# Patient Record
Sex: Female | Born: 1965 | Race: White | Hispanic: No | Smoking: Never smoker
Health system: Southern US, Community
[De-identification: ages and names within clinical notes are randomized; demographics above are authoritative.]

## PROBLEM LIST (undated history)

## (undated) DIAGNOSIS — G43109 Migraine with aura, not intractable, without status migrainosus: Secondary | ICD-10-CM

## (undated) HISTORY — DX: Migraine with aura, not intractable, without status migrainosus: G43.109

---

## 1998-07-29 ENCOUNTER — Ambulatory Visit (HOSPITAL_COMMUNITY): Admission: RE | Admit: 1998-07-29 | Discharge: 1998-07-29 | Payer: Self-pay | Admitting: Neurosurgery

## 1998-07-29 ENCOUNTER — Encounter: Payer: Self-pay | Admitting: Neurosurgery

## 2000-10-21 ENCOUNTER — Other Ambulatory Visit: Admission: RE | Admit: 2000-10-21 | Discharge: 2000-10-21 | Payer: Self-pay | Admitting: *Deleted

## 2004-03-04 HISTORY — PX: WISDOM TOOTH EXTRACTION: SHX21

## 2006-02-14 ENCOUNTER — Ambulatory Visit: Payer: Self-pay | Admitting: *Deleted

## 2006-10-14 ENCOUNTER — Encounter: Payer: Self-pay | Admitting: Specialist

## 2006-11-03 ENCOUNTER — Encounter: Payer: Self-pay | Admitting: Specialist

## 2007-06-27 ENCOUNTER — Other Ambulatory Visit: Admission: RE | Admit: 2007-06-27 | Discharge: 2007-06-27 | Payer: Self-pay | Admitting: Obstetrics and Gynecology

## 2007-08-06 ENCOUNTER — Ambulatory Visit: Payer: Self-pay

## 2008-08-27 ENCOUNTER — Other Ambulatory Visit: Admission: RE | Admit: 2008-08-27 | Discharge: 2008-08-27 | Payer: Self-pay | Admitting: Obstetrics and Gynecology

## 2008-11-04 ENCOUNTER — Ambulatory Visit: Payer: Self-pay

## 2009-12-29 ENCOUNTER — Ambulatory Visit: Payer: Self-pay

## 2012-01-03 IMAGING — MG MAM DGTL SCREENING MAMMO W/CAD
1 series · 6 of 6 positions shown · non-contrast
Comparison: none

REASON FOR EXAM: scr
COMMENTS:

[Series 2481: R CC · right · 6 of 6 slices shown]
[im 1/6]
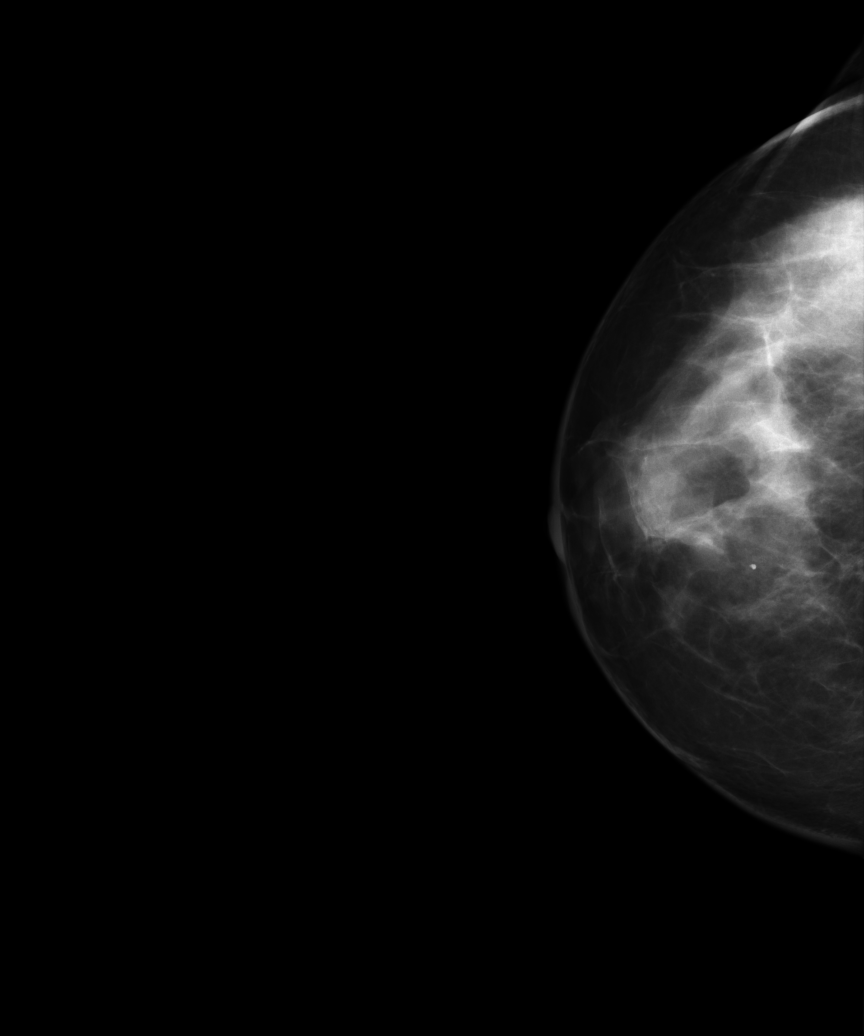
[im 2/6]
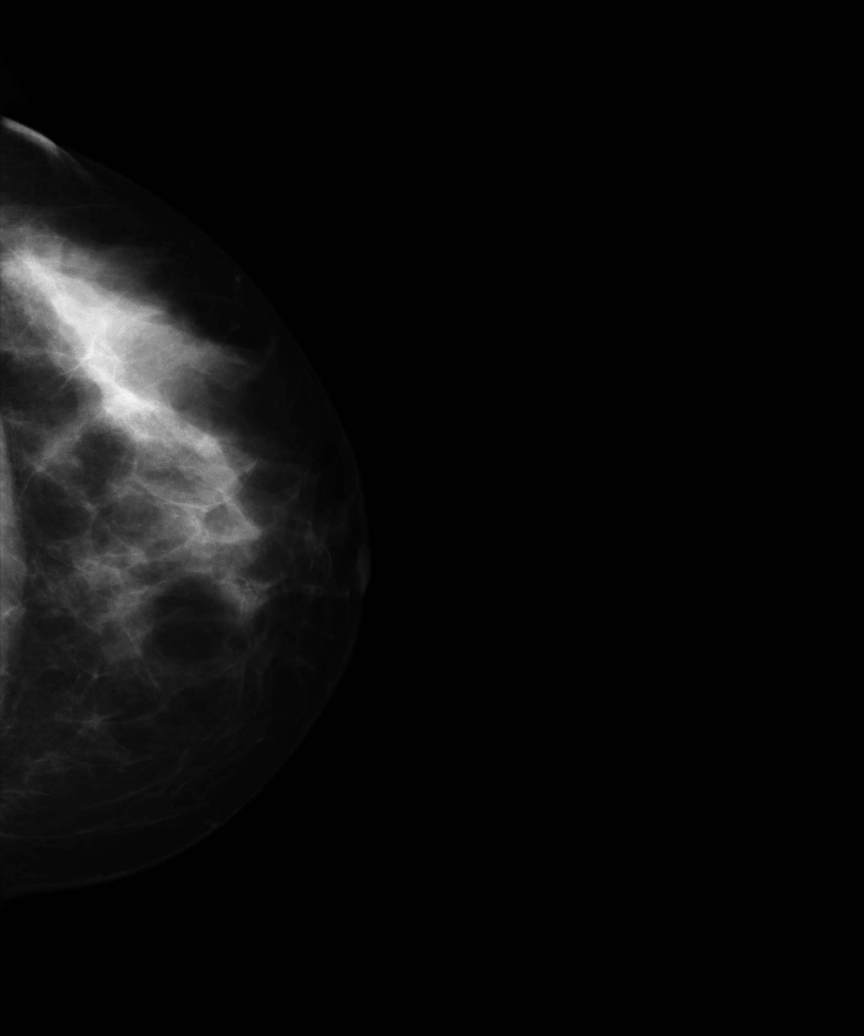
[im 3/6]
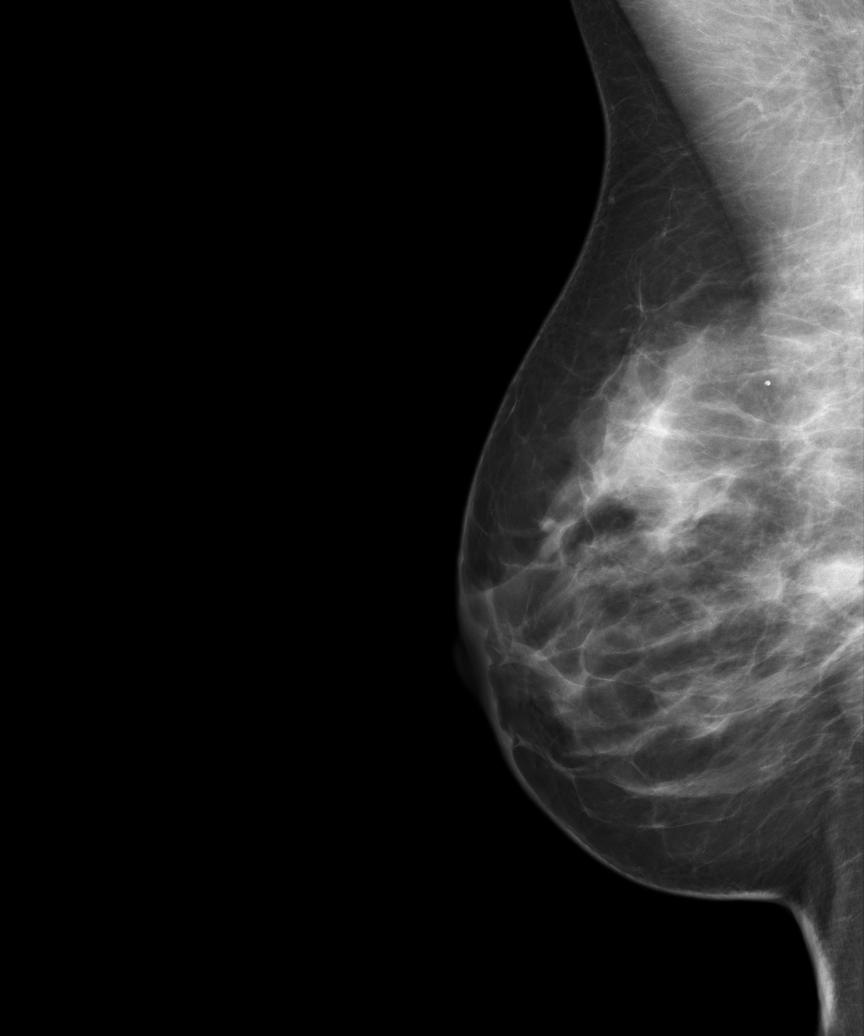
[im 4/6]
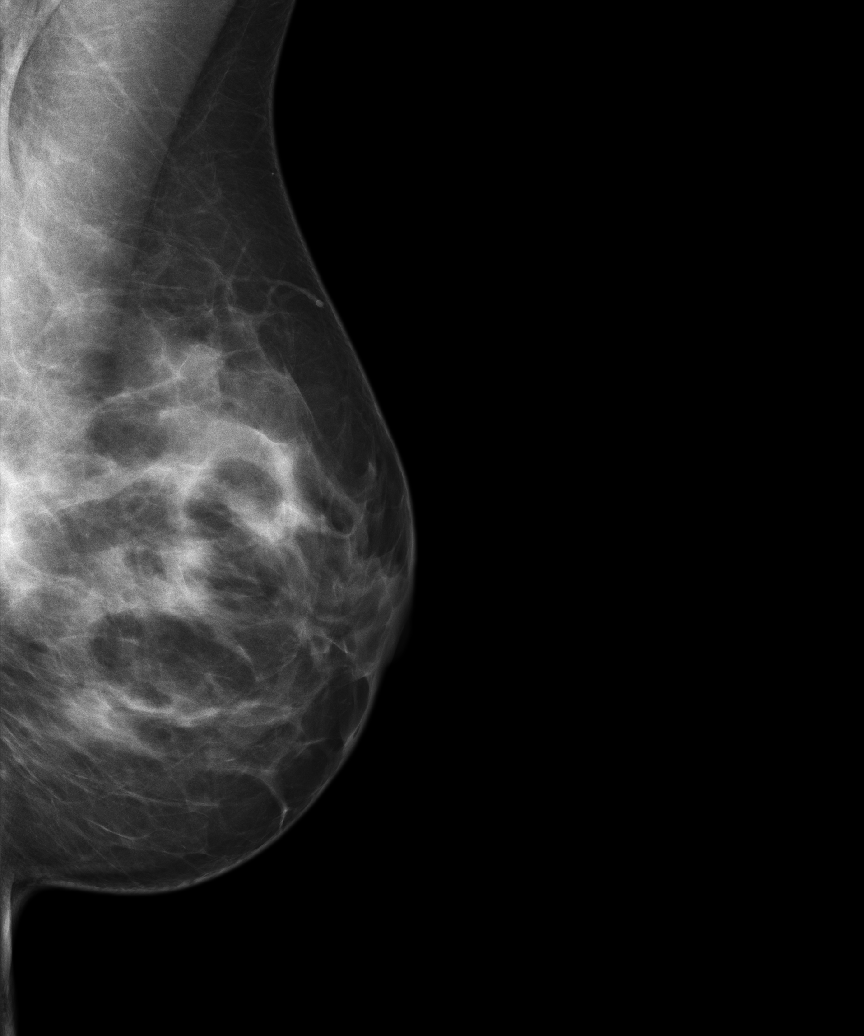
[im 5/6]
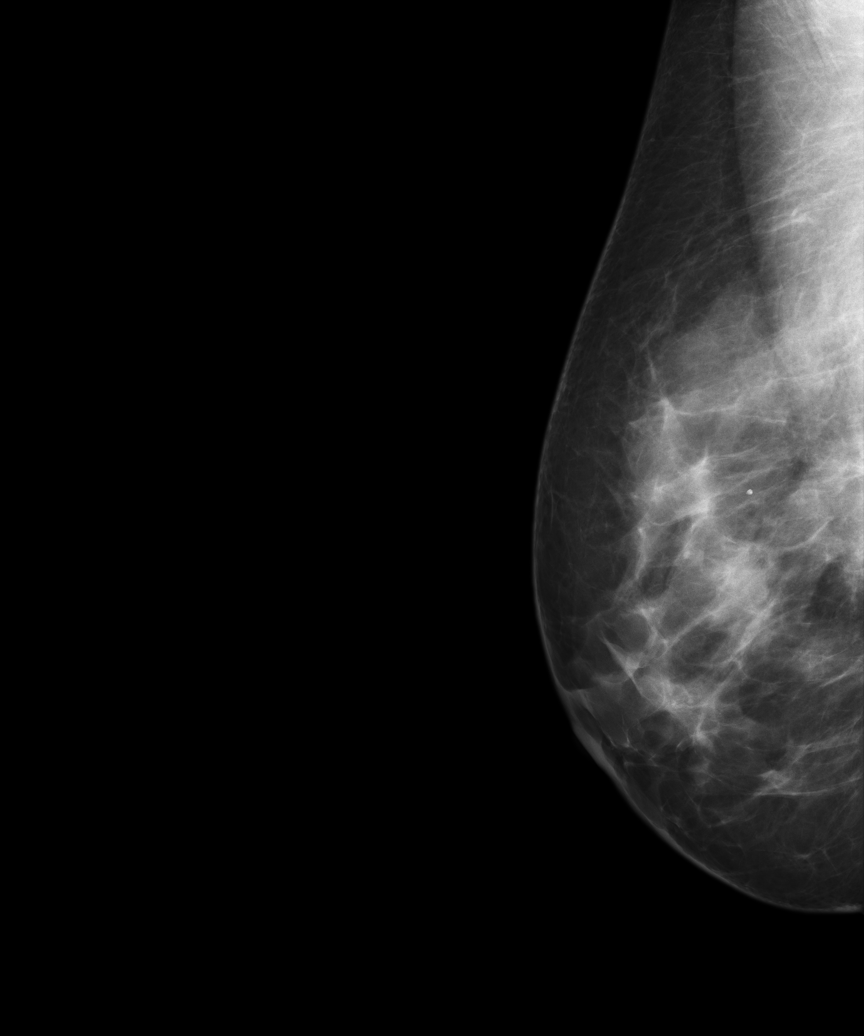
[im 6/6]
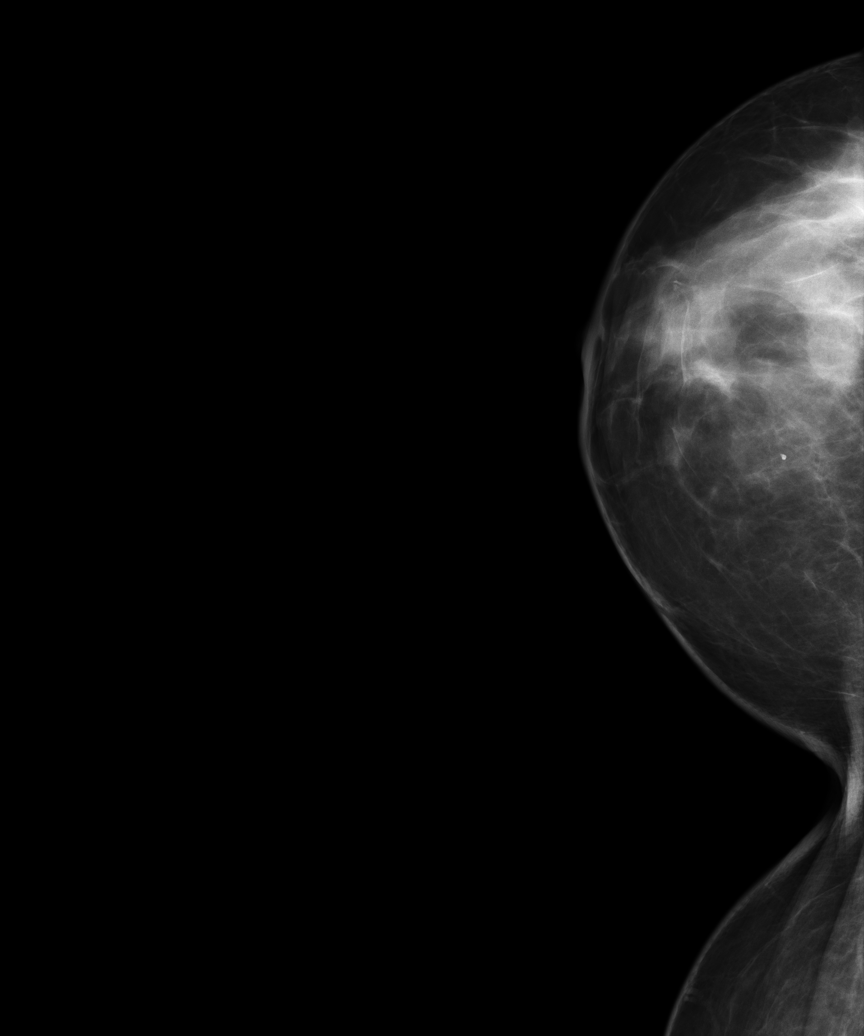

[6 of 6 positions shown; findings below may reference images not displayed]

PROCEDURE:     MAM - MAM DGTL SCREENING MAMMO W/CAD  - December 29, 2009  [DATE]

RESULT:     Comparison is made to previous digital examinations performed on
11/04/2008, 08/06/2007 and to right breast images dated 08/15/2004.

The breasts exhibit a moderate to dense, somewhat heterogeneous parenchymal
pattern. There is stable, benign calcification in the upper central right
breast posteriorly. Additional comparison is made to right breast digital
images of 08/06/2007 and 02/14/2006. On today's MLO exam of the right
breast, there is some parenchymal density projecting over the pectoralis
muscle near the level of the nipple along the extreme posterior margin of
the examined portion of the breast. This appears to be slightly more
prominent than demonstrated on the 3282, 0005 and 8556 images but is similar
in appearance compared to 08/06/2007. This is not evident and is likely too
posterior for visualization on the CC images. Exaggerated CCM and CCL
projections of the right breast show less nodularity of the parenchymal
pattern on the CCL but the posterior parenchymal density seen on the MLO is
likely not visualized in this field. The patient is currently asymptomatic.
There is no known family history of breast cancer.
IMPRESSION: Stable, benign appearing bilateral mammogram.

BI-RADS: Category 2 - Benign Findings

RECOMMENDATION:  Please continue to encourage annual mammographic follow-up.

Thank you for this opportunity to contribute to the care of your patient.

A NEGATIVE MAMMOGRAM REPORT DOES NOT PRECLUDE BIOPSY OR OTHER EVALUATION OF
A CLINICALLY PALPABLE OR OTHERWISE SUSPICIOUS MASS OR LESION. BREAST CANCER
MAY NOT BE DETECTED BY MAMMOGRAPHY IN UP TO 10% OF CASES.

## 2012-07-04 ENCOUNTER — Emergency Department (HOSPITAL_BASED_OUTPATIENT_CLINIC_OR_DEPARTMENT_OTHER)
Admission: EM | Admit: 2012-07-04 | Discharge: 2012-07-04 | Disposition: A | Discharge status: Home / Self Care | Payer: Commercial Managed Care - PPO | Attending: Emergency Medicine | Admitting: Emergency Medicine

## 2012-07-04 ENCOUNTER — Encounter (HOSPITAL_BASED_OUTPATIENT_CLINIC_OR_DEPARTMENT_OTHER): Payer: Self-pay | Admitting: *Deleted

## 2012-07-04 ENCOUNTER — Emergency Department (HOSPITAL_BASED_OUTPATIENT_CLINIC_OR_DEPARTMENT_OTHER): Payer: Commercial Managed Care - PPO

## 2012-07-04 DIAGNOSIS — R059 Cough, unspecified: Secondary | ICD-10-CM | POA: Insufficient documentation

## 2012-07-04 DIAGNOSIS — R079 Chest pain, unspecified: Secondary | ICD-10-CM | POA: Insufficient documentation

## 2012-07-04 DIAGNOSIS — M549 Dorsalgia, unspecified: Secondary | ICD-10-CM

## 2012-07-04 DIAGNOSIS — R05 Cough: Secondary | ICD-10-CM

## 2012-07-04 MED ORDER — HYDROCODONE-ACETAMINOPHEN 5-325 MG PO TABS: 2.0000 | ORAL_TABLET | ORAL | Status: DC | PRN

## 2012-07-04 MED ORDER — CYCLOBENZAPRINE HCL 5 MG PO TABS: 5.0000 mg | ORAL_TABLET | Freq: Two times a day (BID) | ORAL | Status: DC | PRN

## 2012-07-04 MED ORDER — OXYCODONE-ACETAMINOPHEN 5-325 MG PO TABS: 1.0000 | ORAL_TABLET | ORAL | Status: DC | PRN

## 2012-07-04 MED ORDER — KETOROLAC TROMETHAMINE 30 MG/ML IJ SOLN
60.0000 mg | Freq: Once | INTRAMUSCULAR | Status: AC
  Administered 2012-07-04: 60 mg via INTRAMUSCULAR
  Filled 2012-07-04: qty 1

## 2012-07-04 NOTE — ED Notes (Signed)
Cough scratchy throat x 2 days. Sharp pain under her right shoulder blade that has gotten worse.

## 2012-07-04 NOTE — ED Notes (Signed)
Family at bedside. 

## 2012-07-04 NOTE — ED Provider Notes (Signed)
History     CSN: [id number]  Arrival date & time 07/04/12  1811   First MD Initiated Contact with Patient 07/04/12 1831      Chief Complaint  Patient presents with  . Cough    (Consider location/radiation/quality/duration/timing/severity/associated sxs/prior treatment) HPI Comments: Pt states that she is having pain under the right shoulder blade  Patient is a 47 y.o. female presenting with cough. The history is provided by the patient. No language interpreter was used.  Cough This is a new problem. The current episode started 2 days ago. The problem occurs constantly. The problem has not changed since onset.The cough is non-productive. There has been no fever. Treatments tried: nsaid. The treatment provided no relief.    History reviewed. No pertinent past medical history.  Past Surgical History  Procedure Date  . Cesarean section     No family history on file.  History  Substance Use Topics  . Smoking status: Never Smoker   . Smokeless tobacco: Not on file  . Alcohol Use: No    OB History    Grav Para Term Preterm Abortions TAB SAB Ect Mult Living                  Review of Systems  Constitutional: Negative.   Respiratory: Positive for cough.   Cardiovascular: Negative.     Allergies  Review of patient's allergies indicates no known allergies.  Home Medications  No current outpatient prescriptions on file.  BP 126/59  Pulse 70  Temp 98.3 F (36.8 C) (Oral)  Resp 20  SpO2 100%  Physical Exam  Nursing note and vitals reviewed. Constitutional: She is oriented to person, place, and time. She appears well-developed and well-nourished.  HENT:  Head: Normocephalic and atraumatic.  Eyes: Conjunctivae normal are normal.  Neck: Neck supple.  Cardiovascular: Normal rate and regular rhythm.   Pulmonary/Chest: Effort normal and breath sounds normal.       Pt tender in the right posterior rib  Musculoskeletal: Normal range of motion.  Neurological: She is  alert and oriented to person, place, and time.  Skin: Skin is dry.  Psychiatric: She has a normal mood and affect.    ED Course  Procedures (including critical care time)  Labs Reviewed - No data to display Dg Chest 2 View  07/04/2012  *RADIOLOGY REPORT*  Clinical Data: Cough, congestion, pain  CHEST - 2 VIEW  Comparison: None.  Findings: Lungs are clear. No pleural effusion or pneumothorax.  Cardiomediastinal silhouette is within normal limits.  Visualized osseous structures are within normal limits.  IMPRESSION: Normal chest radiographs.   Original Report Authenticated By: Charline Bills, M.D.      1. Back pain   2. Cough       MDM  Pt is feeling somewhat better after toradol:will treat with vicodin and flexeril at home        Teressa Lower, NP 07/04/12 2031

## 2012-07-04 NOTE — ED Notes (Signed)
MD at bedside. 

## 2012-07-05 NOTE — ED Provider Notes (Signed)
Medical screening examination/treatment/procedure(s) were performed by non-physician practitioner and as supervising physician I was immediately available for consultation/collaboration.  Coryn Mosso, MD 07/05/12 0043 

## 2013-04-08 ENCOUNTER — Encounter: Payer: Self-pay | Admitting: Obstetrics and Gynecology

## 2013-04-10 ENCOUNTER — Ambulatory Visit: Payer: Self-pay | Admitting: Obstetrics and Gynecology

## 2013-04-10 ENCOUNTER — Ambulatory Visit (INDEPENDENT_AMBULATORY_CARE_PROVIDER_SITE_OTHER): Payer: Commercial Managed Care - PPO | Admitting: Gynecology

## 2013-04-10 ENCOUNTER — Encounter: Payer: Self-pay | Admitting: Gynecology

## 2013-04-10 VITALS — BP 108/68 | HR 62 | Resp 16 | Ht 62.75 in | Wt 127.0 lb

## 2013-04-10 DIAGNOSIS — L749 Eccrine sweat disorder, unspecified: Secondary | ICD-10-CM

## 2013-04-10 DIAGNOSIS — G43109 Migraine with aura, not intractable, without status migrainosus: Secondary | ICD-10-CM

## 2013-04-10 DIAGNOSIS — Z0000 Encounter for general adult medical examination without abnormal findings: Secondary | ICD-10-CM

## 2013-04-10 DIAGNOSIS — Z124 Encounter for screening for malignant neoplasm of cervix: Secondary | ICD-10-CM

## 2013-04-10 DIAGNOSIS — Z309 Encounter for contraceptive management, unspecified: Secondary | ICD-10-CM

## 2013-04-10 DIAGNOSIS — Z01419 Encounter for gynecological examination (general) (routine) without abnormal findings: Secondary | ICD-10-CM

## 2013-04-10 LAB — HEMOGLOBIN, FINGERSTICK: Hemoglobin, fingerstick: 13.7 g/dL (ref 12.0–16.0)

## 2013-04-10 MED ORDER — NORETHINDRONE 0.35 MG PO TABS: 1.0000 | ORAL_TABLET | Freq: Every day | ORAL | Status: DC
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
## 2013-04-10 NOTE — Patient Instructions (Signed)

## 2013-04-13 ENCOUNTER — Other Ambulatory Visit: Payer: Self-pay | Admitting: Obstetrics and Gynecology

## 2013-04-13 NOTE — Telephone Encounter (Signed)
Spoke with pt. And advised RX was Escribed on 04-10-13 at 3:41 pm to pharmacy.  She will call pharmacy to verify.

## 2013-04-13 NOTE — Telephone Encounter (Signed)
Pt calling to check on refill request for Micronor to [facility] at [phone].

## 2013-04-13 NOTE — Telephone Encounter (Signed)
Called pt. To verify her pharmacy had received her RX for OCP's --Micronor.  LMOVM.

## 2013-04-15 LAB — IPS PAP TEST WITH HPV

## 2013-04-16 ENCOUNTER — Other Ambulatory Visit: Payer: Self-pay | Admitting: Gynecology

## 2013-04-16 DIAGNOSIS — IMO0002 Reserved for concepts with insufficient information to code with codable children: Secondary | ICD-10-CM

## 2013-04-20 ENCOUNTER — Telehealth: Payer: Self-pay | Admitting: Orthopedic Surgery

## 2013-04-20 NOTE — Telephone Encounter (Signed)
Patient returned call and spoke with patient earlier. Was given instructions on procedure. She would like to reschedule because of thanksgiving travel.  Appointment rescheduled.

## 2013-04-20 NOTE — Telephone Encounter (Signed)
LMTCB  aa 

## 2013-04-20 NOTE — Telephone Encounter (Signed)
Message copied by Alfredo Batty on Mon Apr 20, 2013  2:28 PM ------      Message from: Douglass Rivers      Created: Thu Apr 16, 2013  8:11 AM       Will need colpo, order dropped, ASCUS+HRHPV ------

## 2013-04-20 NOTE — Telephone Encounter (Signed)
LMTCB to discuss benefits for Colpo

## 2013-04-21 NOTE — Telephone Encounter (Signed)
04-20-13 2:35 pm-Spoke with pt about Pap results and scheduled colpo with TL 04-27-13. See result note.

## 2013-04-27 ENCOUNTER — Ambulatory Visit: Payer: Commercial Managed Care - PPO | Admitting: Gynecology

## 2013-05-11 ENCOUNTER — Ambulatory Visit (INDEPENDENT_AMBULATORY_CARE_PROVIDER_SITE_OTHER): Payer: Commercial Managed Care - PPO | Admitting: Gynecology

## 2013-05-11 ENCOUNTER — Encounter: Payer: Self-pay | Admitting: Gynecology

## 2013-05-11 VITALS — BP 118/74 | HR 64 | Resp 12 | Ht 62.75 in | Wt 127.0 lb

## 2013-05-11 DIAGNOSIS — IMO0002 Reserved for concepts with insufficient information to code with codable children: Secondary | ICD-10-CM

## 2013-05-11 DIAGNOSIS — R6889 Other general symptoms and signs: Secondary | ICD-10-CM

## 2013-05-11 NOTE — Patient Instructions (Signed)
Colposcopy Post Procedure Instructions  * You may take Ibuprofen, Aleve, or Tylenol for cramping as if needed.  * If Monsel's solution is used, you may have a slight black discharge for a day or two.   * Light bleeding is normal. If bleeding is heavier than your period, please call our office.  * Refrain from putting anything in your vagina until the bleeding or discharge stops (usually 2 or 3 days).  * You will be notified within one week of your biopsy results, or we will discuss your results at your follow-up appointment if needed.  

## 2013-05-11 NOTE — Progress Notes (Signed)
Subjective:     Patient ID: Megan Powers, female   DOB: Sep 01, 1965, 47 y.o.   MRN: [id number]  HPI Comments: 47 yo G2P2, here for colpo for ASCUS +HRHPHPV.  2 Patient's last menstrual period was 02/02/2013.  Pt is on micronor  Procedure explained and patient's questions were invited and answered.  Consent form signed.    Role of HPV in genesis of SIL discussed with patient, and questions answered.      Review of Systems     Objective:   Physical Exam  Nursing note and vitals reviewed. Constitutional: She is oriented to person, place, and time. She appears well-developed and well-nourished.  Genitourinary:    Neurological: She is alert and oriented to person, place, and time.       Assessment:     ASCUS, +HRHPV     Plan:     Colposcopy      Blood pressure 118/74, pulse 64, resp. rate 12, height 5' 2.75" (1.594 m), weight 127 lb (57.607 kg), last menstrual period 02/02/2013.   Speculum inserted atraumatically and cervix visualized.  3% acetic acid applied.  Cervix examined using 3.75 and 7.5   X magnification and green filter.    Gross appearance:normal  Squamocolumnar junction seen in entirety: yes   no visible lesions, no mosaicism, no punctation and no abnormal vasculature  cervix swabbed with Lugol's solution, endocervical speculum placed, SCJ visualized 360 degrees without lesions, endocervical curettage performed, specimen labelled and sent to pathology and hemostasis achieved with Monsel's solution  Extent of lesion entirely seen: yes  Patient tolerated procedure well.   Plan:  Will base further treatment on Pathology findings.  Post biopsy instructions and AVS given to patient.

## 2013-05-27 ENCOUNTER — Encounter: Payer: Self-pay | Admitting: Neurology

## 2013-05-27 ENCOUNTER — Ambulatory Visit (INDEPENDENT_AMBULATORY_CARE_PROVIDER_SITE_OTHER): Payer: Commercial Managed Care - PPO | Admitting: Neurology

## 2013-05-27 VITALS — BP 105/69 | HR 69 | Resp 17 | Ht 65.0 in | Wt 128.0 lb

## 2013-05-27 DIAGNOSIS — G43909 Migraine, unspecified, not intractable, without status migrainosus: Secondary | ICD-10-CM

## 2013-05-27 DIAGNOSIS — L749 Eccrine sweat disorder, unspecified: Secondary | ICD-10-CM

## 2013-05-27 DIAGNOSIS — G909 Disorder of the autonomic nervous system, unspecified: Secondary | ICD-10-CM

## 2013-05-27 DIAGNOSIS — G901 Familial dysautonomia [Riley-Day]: Secondary | ICD-10-CM

## 2013-05-27 NOTE — Progress Notes (Addendum)
[facility]  Provider:  Melvyn Powers, M D  Referring Provider: Bennye Alm, MD Primary Care Physician:  No PCP Per Patient  Chief Complaint  Patient presents with  . eccrine sweat disorder    NP, internal referral, Rm 11    HPI:  Megan Powers is a 47 y.o. female  Is seen here as a referral  from Megan Powers for eccrine gland disorder, Dysautonomia.   Megan Powers is seen here today for an evaluation of dysautonomia of the left lower extremity.    The patient reports she notices intermittently her left leg to be sweating profusely, while her right leg or the rest of the body ( torso, arms, face )does not. At other times the left leg feels cold and clammy- and no other body part seems to do the same at the same time.  This patient is physically very active and young appearing , she is a regular exerciser and has especially noted the asymmetry in the lower extremities skin color,  puffiness and diaphoresis when exercising. She mentioned this to her gynecologist about 2 years ago, but she is not quite sure when she first noticed this dysfunction. 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  During that time she certainly didn't have the opportunity for reflection and little time for herself.   Her daughter got married about 4 years ago and the family took wedding pictures in [place]- It was there when her daughter noticed her mother's left leg to be sweating and the shorts to be drenched only on the left . She describes that her left foot's sock will be wet,  The whole leg up to the groin line and the  left buttock is sweating. There is no involvement of the torso. The crouch of her underwear may be wet, too.  In short:  Unilateral lower extremity sweating , not involving torso or upper extremity. Ongoing for about 4 years, likely longer . She has right  migraines with right facial numbness.  She has known anisocoria-  Adie's pupil , she was diagnosed  by neuro-ophthalmologist Megan Powers at [facility],    Review of Systems: Out of a complete 14 system review, the patient complains of only the following symptoms, and all other reviewed systems are negative.  unilateral leg sweating , remote history of migraines, adie's pupil.   History   Social History  . Marital Status: Divorced    Spouse Name: N/A    Number of Children: 2  . Years of Education: college   Occupational History  . [employer]    Social History Main Topics  . Smoking status: Never Smoker   . Smokeless tobacco: Not on file  . Alcohol Use: 2.0 oz/week    4 drink(s) per week     Comment: Occassionally  . Drug Use: No  . Sexual Activity: Yes    Partners: Male   Other Topics Concern  . Not on file   Social History Narrative  . No narrative on file    Family History  Problem Relation Age of Onset  . Diabetes Maternal Grandmother   . Diabetes Paternal Grandmother   . CVA Mother   . Cerebral aneurysm Mother   . Heart attack Father 27  . Dementia Father     Past Medical History  Diagnosis Date  . Migraine with aura  Past Surgical History  Procedure Laterality Date  . Cesarean section  1988  . Wisdom tooth extraction  03/2004    Current Outpatient Prescriptions  Medication Sig Dispense Refill  . Acetaminophen (TYLENOL PO) Take by mouth as needed.      . norethindrone (MICRONOR,CAMILA,ERRIN) 0.35 MG tablet Take 1 tablet (0.35 mg total) by mouth daily.  3 Package  3   No current facility-administered medications for this visit.    Allergies as of 05/27/2013  . (No Known Allergies)    Vitals: BP 105/69  Pulse 69  Resp 17  Ht 5\' 5"  (1.651 m)  Wt 128 lb (58.06 kg)  BMI 21.30 kg/m2  LMP 02/02/2013 Last Weight:  Wt Readings from Last 1 Encounters:  05/27/13 128 lb (58.06 kg)   Last Height:   Ht Readings from Last 1 Encounters:   05/27/13 5\' 5"  (1.651 m)    Physical exam:  General: The patient is awake, alert and appears not in acute distress. The patient is well groomed. Head: Normocephalic, atraumatic. Neck is supple. Mallampati 2  neck circumference: 14 inches.   Cardiovascular:  Regular rate and rhythm, without  murmurs or carotid bruit, and without distended neck veins. Respiratory: Lungs are clear to auscultation. Skin:  Without evidence of edema, or rash- today there is no sweating.  Trunk: BMI is normal, this  patient  has normal posture.  Neurologic exam : The patient is awake and alert, oriented to place and time.  Memory subjective  described as intact. There is a normal attention span & concentration ability. Speech is fluent without  dysarthria, dysphonia or aphasia.  Mood and affect are appropriate.  Cranial nerves: Pupils with anisocoria , left wider by 2 mm wider - and  Left less briskly reactive to light. Disrounded , but only her right pupil- right eye is photophobic. Tear flow from the left .  Funduscopic exam without  evidence of pallor or edema. Extraocular movements  in vertical and horizontal planes intact and without nystagmus. Visual fields by finger perimetry are intact. Hearing to finger rub intact. Facial sensation intact to fine touch.  Facial motor strength is symmetric and tongue and uvula move midline.  Motor exam: Normal tone and  muscle bulk and symmetric strength in all extremities.  Sensory:  Fine touch, pinprick and vibration were tested in all extremities.  Proprioception is tested in the upper extremities only. This was normal.  Coordination: Rapid alternating movements, Finger-to-nose maneuver  without evidence of ataxia, dysmetria , but noticeable  action tremor. Noted with both arms outstretched.  Both hands , essential tremor present for  over a decade.  Some times she has sudden jerking movements, affecting her hand writing.   Gait and station: Patient walks without  assistive device and is able and assisted stool climb up to the exam table. Strength within normal limits.  Stance is stable and normal. Tandem gait is unfragmented.  Romberg testing is negative.  Deep tendon reflexes: in the  upper and lower extremities are symmetric and intact.  Babinski maneuver response is downgoing:     Assessment:  After physical and neurologic examination, review of laboratory studies, imaging, neurophysiology testing and pre-existing records, assessment is:   Left leg sweating. This autonomic dysfunction cn only be located at the pelvic level, cervical lesion  would have let to the torso and arm being involved.   No general menopausal hot flushes noticed. No vaginal dryness.   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     Plan:  Treatment plan and additional workup :  Reviewed and discussed , order pelvic CT for evaluation of pelvic ganglia, mass effect ?  There is no history of trauma and no history of malignancy. Patient would like her imaging studies done in [facility] .

## 2013-06-10 NOTE — Addendum Note (Signed)
Addended by: Melvyn NovasHMEIER, Ayanah Snader on: 06/10/2013 12:20 PM   Modules accepted: Orders

## 2013-06-19 ENCOUNTER — Telehealth: Payer: Self-pay | Admitting: Neurology

## 2013-06-19 ENCOUNTER — Other Ambulatory Visit: Payer: Self-pay

## 2013-06-19 ENCOUNTER — Other Ambulatory Visit: Payer: Self-pay | Admitting: *Deleted

## 2013-06-19 DIAGNOSIS — L749 Eccrine sweat disorder, unspecified: Secondary | ICD-10-CM

## 2013-06-19 DIAGNOSIS — G901 Familial dysautonomia [Riley-Day]: Secondary | ICD-10-CM

## 2013-06-19 NOTE — Telephone Encounter (Signed)
RETURNING CALL  °

## 2013-06-19 NOTE — Telephone Encounter (Signed)
I called and spoke to pt about the CT abd / pelvis now with and without contrast per Dr. Hosie PoissonSumner.  Order placed and sent to referrals for authorization.

## 2013-06-19 NOTE — Addendum Note (Signed)
Addended byHermenia Fiscal: Kashif Pooler on: 06/19/2013 03:31 PM   Modules accepted: Orders

## 2013-06-23 ENCOUNTER — Telehealth: Payer: Self-pay | Admitting: Neurology

## 2013-06-23 DIAGNOSIS — L749 Eccrine sweat disorder, unspecified: Secondary | ICD-10-CM

## 2013-06-23 DIAGNOSIS — G901 Familial dysautonomia [Riley-Day]: Secondary | ICD-10-CM

## 2013-06-23 NOTE — Telephone Encounter (Signed)
Dr. Vickey Hugerohmeier did speak with the Dr.

## 2013-06-23 NOTE — Telephone Encounter (Signed)
Dr. Devoria GlassingHassles (?) office calling to speak with Dr. Vickey Hugerohmeier about the CT scan they are going to do, attempted to page and call assistant and nurse but no response.

## 2013-06-25 ENCOUNTER — Other Ambulatory Visit: Payer: Self-pay

## 2013-06-25 DIAGNOSIS — G901 Familial dysautonomia [Riley-Day]: Secondary | ICD-10-CM

## 2013-06-25 DIAGNOSIS — L749 Eccrine sweat disorder, unspecified: Secondary | ICD-10-CM

## 2013-06-25 NOTE — Telephone Encounter (Signed)
Per Dr. Vickey Hugerohmeier change order to CT pelvis with oral and IV contrast.   At [facility].  [facility].  [id number], f [phone]

## 2013-12-15 ENCOUNTER — Ambulatory Visit: Payer: Commercial Managed Care - PPO | Admitting: Neurology

## 2014-02-15 ENCOUNTER — Telehealth: Payer: Self-pay

## 2014-02-15 DIAGNOSIS — Z30011 Encounter for initial prescription of contraceptive pills: Secondary | ICD-10-CM

## 2014-02-15 MED ORDER — NORETHINDRONE 0.35 MG PO TABS: 1.0000 | ORAL_TABLET | Freq: Every day | ORAL | Status: DC

## 2014-02-15 NOTE — Telephone Encounter (Signed)
Last AEX: 04/10/13 Last refill:04/10/13 #3 packs X 3 Current AEX: 04/16/14  Sent refill until AEX

## 2014-02-24 IMAGING — CR DG CHEST 2V
2 series · 2 of 2 positions shown · non-contrast
Comparison: None.

CLINICAL DATA: Cough, congestion, pain

CHEST - 2 VIEW

[w chest pa]
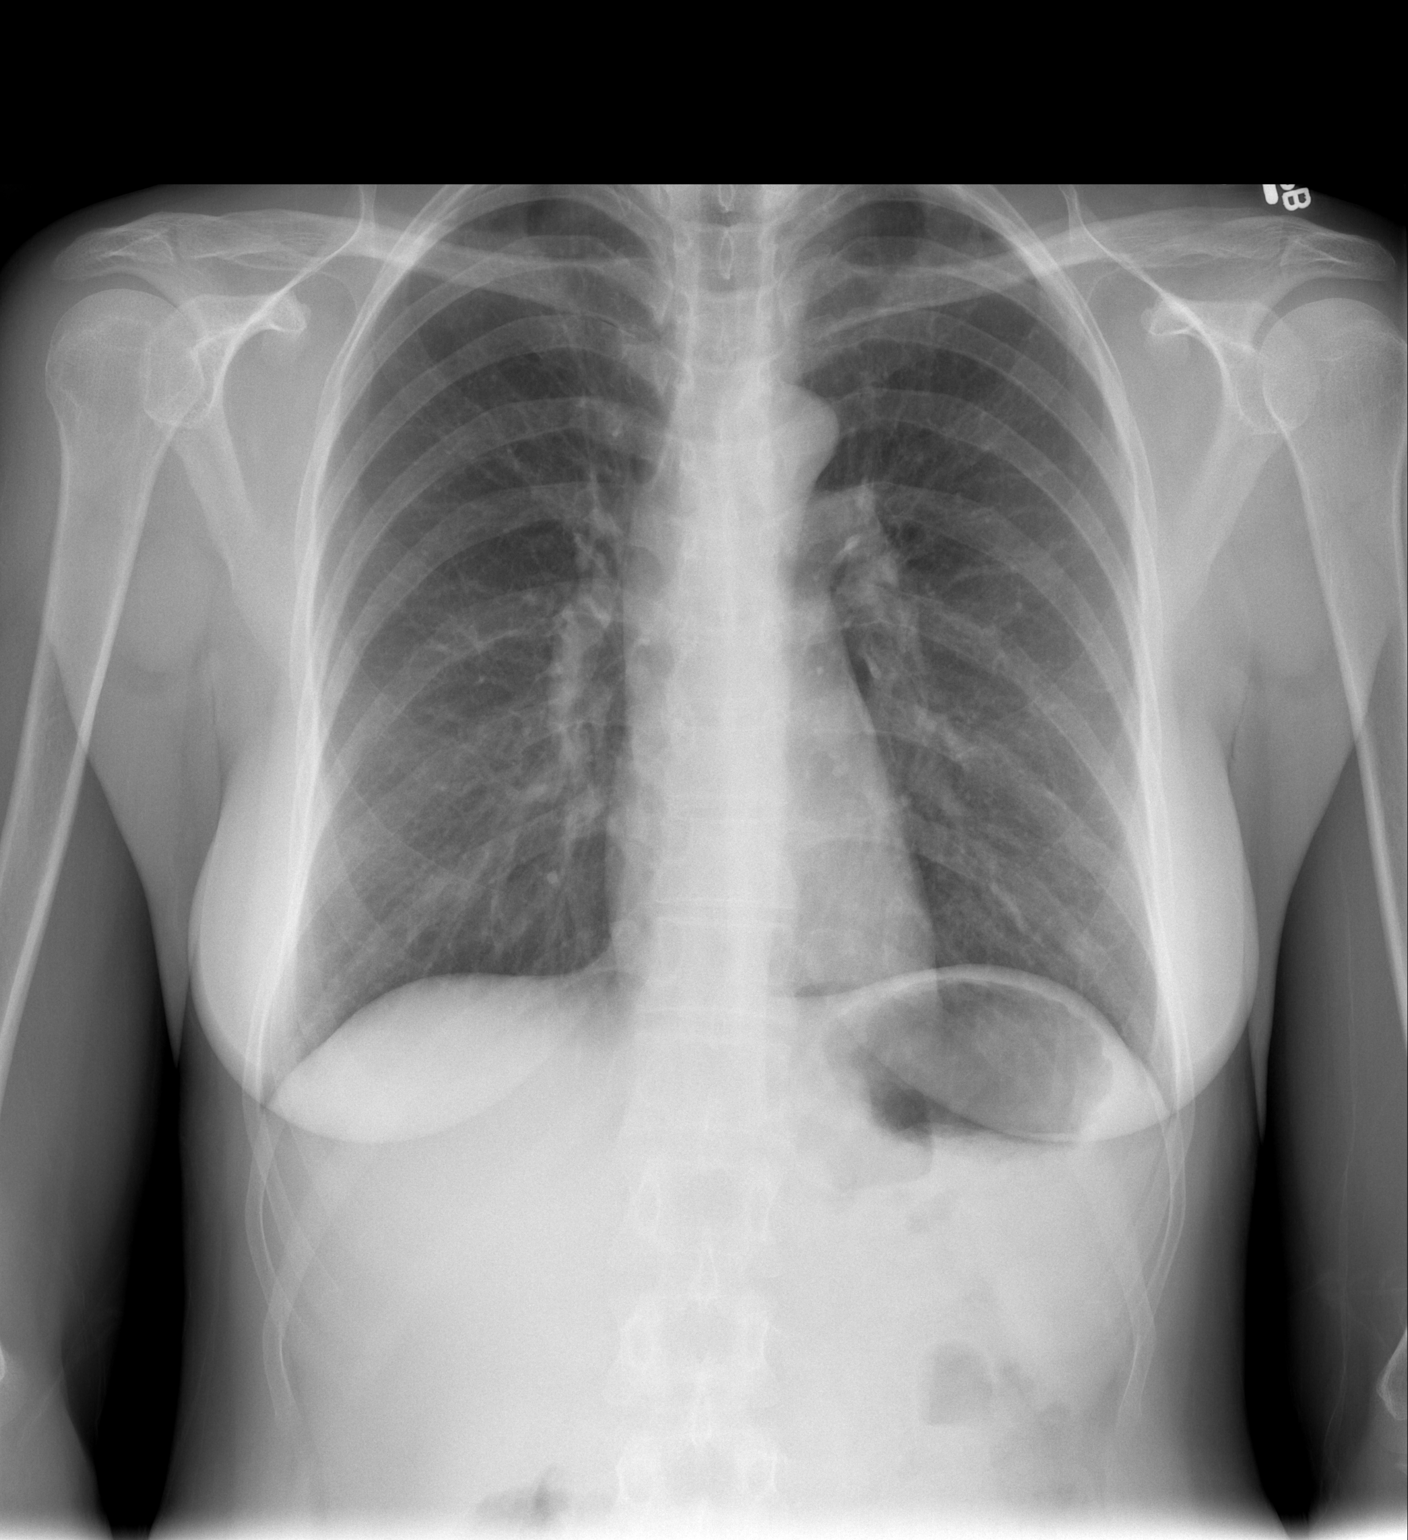

[w chest lat]
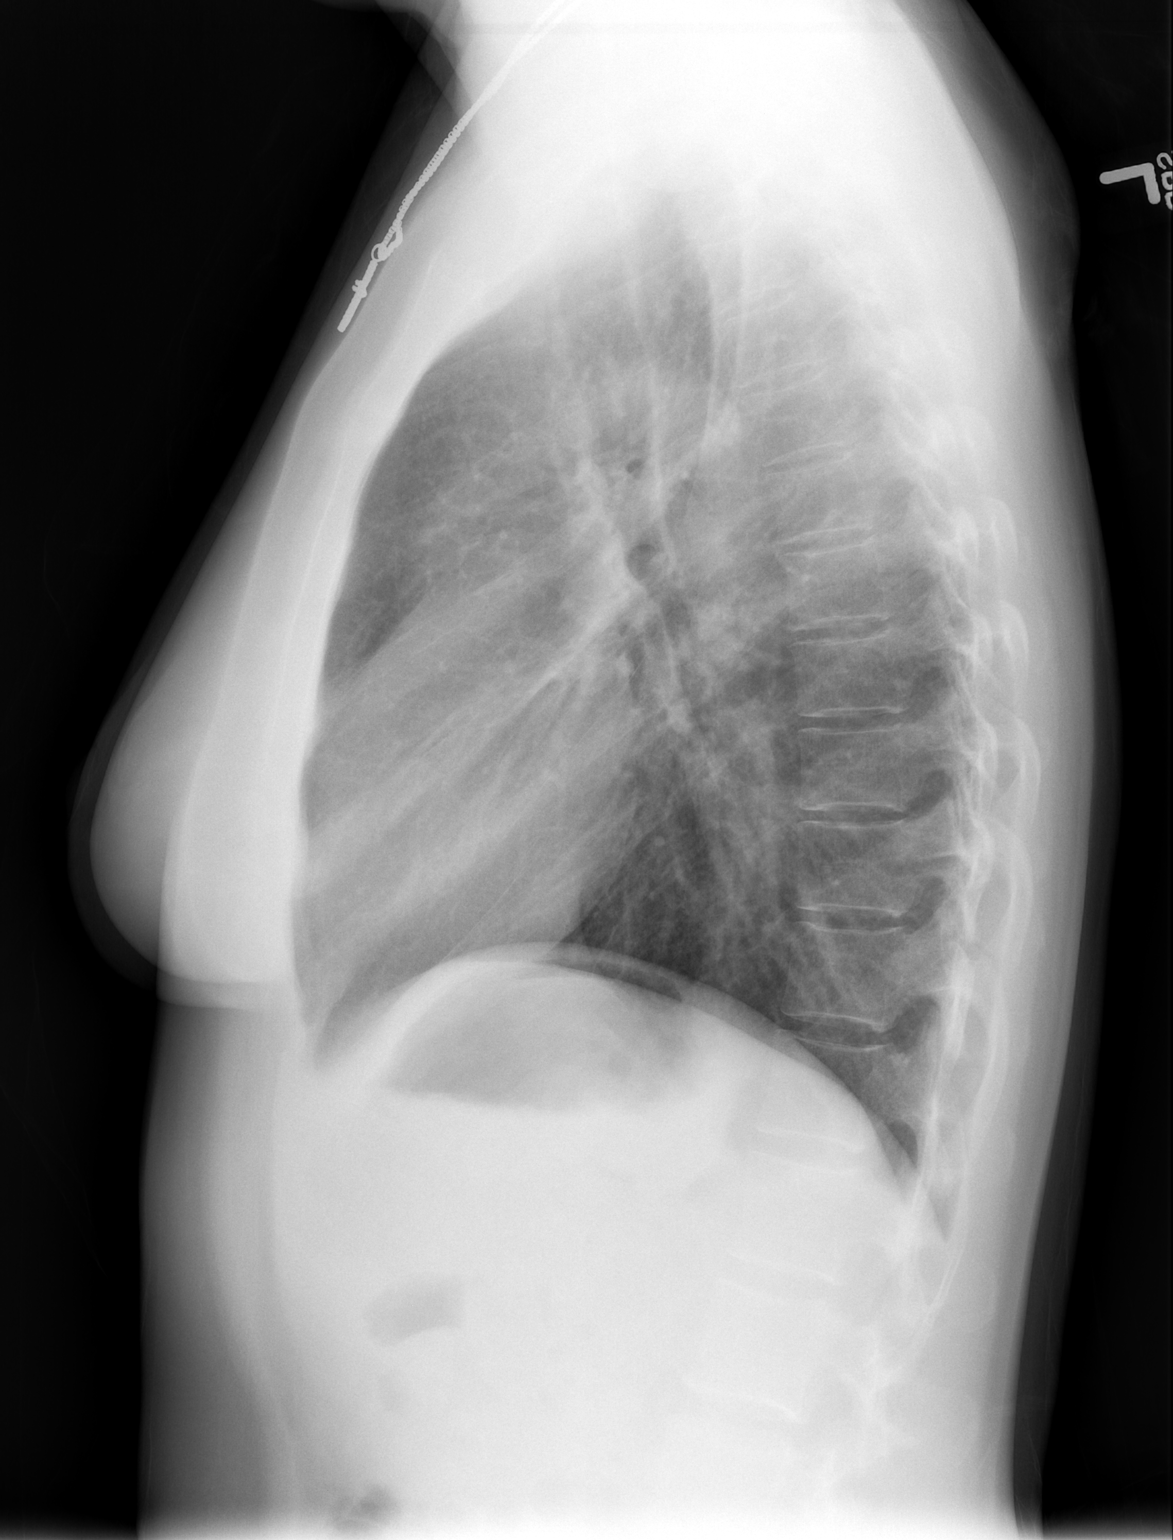

[2 of 2 positions shown; findings below may reference images not displayed]

FINDINGS: Lungs are clear. No pleural effusion or pneumothorax.

Cardiomediastinal silhouette is within normal limits.

Visualized osseous structures are within normal limits.
IMPRESSION: Normal chest radiographs.

## 2014-03-02 ENCOUNTER — Telehealth: Payer: Self-pay | Admitting: Gynecology

## 2014-03-02 NOTE — Telephone Encounter (Signed)
Patient calling for refill on: norethindrone (MICRONOR,CAMILA,ERRIN) 0.35 MG tablet  Take 1 tablet (0.35 mg total) by mouth daily., Starting 02/15/2014, Until Discontinued, Normal, Last Dose: Not Recorded  Refills: 0 ordered Pharmacy: [facility] - [place], [place] - [id number]  Pharmacy on file is correct. AEX 04/16/14

## 2014-03-02 NOTE — Telephone Encounter (Signed)
02/15/14 #3 Packs with [facility] refills [facility] Called Pharmacy and the patient does have refills Called patient and LM on voicemail that she can have refills transferred to her local pharmacy and to call us if she has any problems or issues.  Routed to provider for review, encounter closed.

## 2014-04-01 ENCOUNTER — Telehealth: Payer: Self-pay | Admitting: Gynecology

## 2014-04-01 NOTE — Telephone Encounter (Signed)
Call to pt to let her know that dr lathrop is no longer with the practice and we need to rs her appt lmtcb °

## 2014-04-02 ENCOUNTER — Telehealth: Payer: Self-pay | Admitting: Gynecology

## 2014-04-02 NOTE — Telephone Encounter (Signed)
Left patient message to call back [facility]. She does still have one refill there, verified with pharmacy.//kn

## 2014-04-02 NOTE — Telephone Encounter (Signed)
Pt has moved out of state and is going to see find someone closer to home pt is requesting a refill of bc. Was pt of dr lathrop her appt got canceled.  norethindrone (MICRONOR,CAMILA,ERRIN) 0.35 MG tablet  Take 1 tablet (0.35 mg total) by mouth daily., Starting 02/15/2014, Until Discontinued, Normal, Last Dose: Not Recorded  Refills: 0 ordered Pharmacy: [facility] - [place], [place] - [id number]

## 2014-04-05 ENCOUNTER — Encounter: Payer: Self-pay | Admitting: Neurology

## 2014-04-08 ENCOUNTER — Telehealth: Payer: Self-pay | Admitting: *Deleted

## 2014-04-08 NOTE — Telephone Encounter (Signed)
Recall removed

## 2014-04-08 NOTE — Telephone Encounter (Addendum)
Patient in recall for 08 pap recall, patient had colpo bx done 05/11/13 per Dr. Farrel GobbleLathrop patient was to have f/u pap in 1 year.  According to 04/02/14 phone note patient has moved out of state and plans on finding a GYN there.  Please advise.

## 2014-04-08 NOTE — Telephone Encounter (Signed)
ASCUS pap with + HR HPV.  Colpo with negative biopsies.  Pt aware of need for follow-up.  Ok to remove from recall.  CC:  Megan RuddySally Powers

## 2014-04-08 NOTE — Telephone Encounter (Signed)
Encounter closed

## 2014-04-16 ENCOUNTER — Ambulatory Visit: Payer: Commercial Managed Care - PPO | Admitting: Gynecology

## 2014-05-31 ENCOUNTER — Telehealth: Payer: Self-pay | Admitting: Gynecology

## 2014-05-31 DIAGNOSIS — Z30011 Encounter for initial prescription of contraceptive pills: Secondary | ICD-10-CM

## 2014-05-31 NOTE — Telephone Encounter (Signed)
Pt has moved and wondering if she can get one refill of the Micronor birth control until she find a doctor.

## 2014-05-31 NOTE — Telephone Encounter (Signed)
I am not able to fill her Rx for birth control pills.  Patient is overdue for a mammogram.  Last annual exam here was over 1 year ago.

## 2014-05-31 NOTE — Telephone Encounter (Signed)
Patient currently on Micronor for birth control. [place] and is requesting refill be sent for one month until she is able to find a provider. Last aex on 04/10/13 with Dr.Lathrop. Last refill given on 02/15/14 for Micronor #3 0RF until patient could find provider in new area. Please advise.

## 2014-06-01 NOTE — Telephone Encounter (Signed)
Spoke with patient. Advised patient of message as seen below from Dr.Silva. Patient states that she had a mammogram in January of 2015 at [facility]. Patient will call to have report from mammogram faxed to our office for Dr.Silva's review.

## 2014-06-01 NOTE — Telephone Encounter (Signed)
Mammogram results to your desk for review.

## 2014-06-02 MED ORDER — NORETHINDRONE 0.35 MG PO TABS: 1.0000 | ORAL_TABLET | Freq: Every day | ORAL | Status: AC

## 2014-06-02 NOTE — Telephone Encounter (Signed)
Left message to call Kaitlyn at [phone]. 

## 2014-06-02 NOTE — Telephone Encounter (Signed)
Mammogram reviewed and normal. OK for one month of Micronor and no refills.

## 2014-06-02 NOTE — Telephone Encounter (Signed)
Spoke with patient. Advised patient of message as seen below from Dr.Silva. Patient is agreeable. Rx for Micronor #1 0RF [facility]. Patient is agreeable.  Routing to provider for final review. Patient agreeable to disposition. Will close encounter
